# Patient Record
Sex: Male | Born: 1990 | Race: Black or African American | Hispanic: No | Marital: Single | Smoking: Never smoker
Health system: Southern US, Community
[De-identification: ages and names within clinical notes are randomized; demographics above are authoritative.]

## PROBLEM LIST (undated history)

## (undated) HISTORY — PX: SHOULDER SURGERY: SHX246

---
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
## 2010-02-25 ENCOUNTER — Emergency Department (HOSPITAL_COMMUNITY)
Admission: EM | Admit: 2010-02-25 | Discharge: 2010-02-25 | Payer: Self-pay | Source: Home / Self Care | Admitting: Emergency Medicine

## 2010-03-11 ENCOUNTER — Encounter (INDEPENDENT_AMBULATORY_CARE_PROVIDER_SITE_OTHER): Payer: Self-pay | Admitting: *Deleted

## 2010-10-06 ENCOUNTER — Ambulatory Visit
Admission: RE | Admit: 2010-10-06 | Discharge: 2010-10-06 | Payer: Self-pay | Source: Home / Self Care | Attending: Orthopedic Surgery | Admitting: Orthopedic Surgery

## 2010-10-06 ENCOUNTER — Encounter: Payer: Self-pay | Admitting: Orthopedic Surgery

## 2010-10-06 DIAGNOSIS — M24819 Other specific joint derangements of unspecified shoulder, not elsewhere classified: Secondary | ICD-10-CM | POA: Insufficient documentation

## 2010-11-01 NOTE — Letter (Signed)
Summary: Generic Letter for [facility]  [name]  [place], [place] [address]   Phone: [phone]  Fax: [phone]       03/11/2010  [facility] [address] [place], [place]  [address]   Dear [name], and To Whom This May Concern:  Antonio Mitchell is release from treatment/surgery for his left shoulder.   If further information is necessary, please contact this office.    Sincerely,   Terrance Mass, MD
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
## 2010-11-03 NOTE — Letter (Signed)
Summary: Notes faxed to [employer]  Notes faxed to [employer]   Imported By: Jacklynn Ganong 10/06/2010 15:18:41  _____________________________________________________________________  External Attachment:    Type:   Image     Comment:   External Document

## 2010-11-03 NOTE — Letter (Signed)
Summary: Generic Letter  [name]  [name]  [place], [place] [address]   Phone: [phone]  Fax: [phone]    10/06/2010   Ref:  Antonio Mitchell [address] [place], [place]  [address]  Dear [employer] recruiter:   [name] had successful repair of his left shoulder in 2008 and has been symptom free since then. I re-examined him today and his shoulder is stable. He is cleared to do any [employer] related activity.     Sincerely,   Fuller Canada MD

## 2011-02-14 NOTE — Op Note (Signed)
NAME:  Downen, Tavarus              ACCOUNT NO.:  [id number]   MEDICAL RECORD NO.:  [id number]          PATIENT TYPE:  INP   LOCATION:  A311                          FACILITY:  [facility]   PHYSICIAN:  Vickki Hearing, M.D.DATE OF BIRTH:  1991-05-17   DATE OF PROCEDURE:  06/14/2007  DATE OF DISCHARGE:                               OPERATIVE REPORT   HISTORY:  20 year old male had his third dislocation of his left  shoulder.  The initial dislocation was traumatic secondary to a football  injury. Dislocated a second time, had physical therapy, was noncompliant  with wearing a shoulder harness. Several months back, he was getting off  of a couch, did a push up, and the shoulder came out of place. After an  extended discussion with his mother, they opted for an open Bankart  procedure versus arthroscopic procedure in [facility]   PREOPERATIVE DIAGNOSIS:  Recurrent dislocation, left shoulder.   POSTOPERATIVE DIAGNOSIS:  Recurrent dislocation, left shoulder.   PROCEDURE:  Open left Bankart repair with capsular shift.   SURGEON:  Vickki Hearing, M.D.   ASSISTANT:  Laurena Bering   OPERATIVE FINDINGS:  There was a Bankart lesion from the 10 o'clock to 6  o'clock position of the left shoulder.  The capsular avulsion was noted  and there was redundant capsule.   ANESTHESIA:  General.   DESCRIPTION OF PROCEDURE:  The patient was properly identified in the  preop holding area, the left shoulder marked for surgery, countersigned  by the surgeon, history and physical updated, antibiotics given.  The  patient was taken to surgery for general anesthetic. After general  anesthetic, exam under anesthesia was done on the left shoulder.  Although there was no inferior subluxation, there was anterior  subluxation with abduction and external rotation.  This would best be  graded as a grade 2 instability with no clunk.  The shoulder was  dislocateble and spontaneously re-located without any  anterior pressure.   The time out procedure was completed after prep and drape.  The subcu  tissue was infiltrated with Marcaine and epinephrine solution.  A  straight incision was made in the axillary fold lateral to the coracoid,  the subcutaneous tissue divided, the cephalic vein found, preserved, and  retracted laterally.  The clavipectoral fascia was dissected until the  lateral portion of the strap muscle was found. This was retracted  medially.  The arm was placed in maximum external rotation.  The  subscapularis muscle and tendon were incised 1 cm from the tuberosity.  The inferior third was preserved. The vasculature was also preserved.  The subscapularis was retracted medially.  A dinner fork retractor was  placed in the scapular neck. The capsule was incised vertically and  entered 5 mm lateral to the glenoid rim.  The joint was inspected,  irrigated, and found to be clean.  A Bankart lesion was found medially.  Four suture anchors were placed at 6, 8, 10, and 11 o'clock. Inferior  capsular redundancy was removed by placing the inferior suture and  advancing the capsule superiorly and medially.  The capsule was further  advanced superiorly and medially with the sutures through the suture  anchors. This was done with the arm in 30 degrees external rotation.  Pants over vest repair was completed.  The sutures were then cut.  The  arm was checked and I was able to get 30 degrees external rotation at 0  degrees, adduction and in the neutral scapular plane.   We then repaired the subscap with 2-0 Ethibond suture.  We checked  external rotation with the arm and found the suture line to be stable.  We were able to get 30 degrees external rotation without any problem.  We irrigated the wound and closed with 2-0 Monocryl over a pain pump  catheter, placed staples in the skin, applied a sterile bandage and  placed the patient in a Cryo/cuff.  He was then extubated and taken to  the  recovery room in stable condition.   We will use  the standard Bankart reconstruction protocol limiting  external rotation to 15 degrees in the first two weeks, advance to 30  degrees at four weeks, and then as tolerated.  Passive range of motion 0  to 90 for the first two weeks, active assisted range of motion, as well,  then advance as tolerated. Elbow, wrist and hand motion can start  immediately.      Vickki Hearing, M.D.  Electronically Signed     SEH/MEDQ  D:  06/14/2007  T:  06/15/2007  Job:  [id number]
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
## 2011-07-14 LAB — HEMOGLOBIN AND HEMATOCRIT, BLOOD
HCT: 40
Hemoglobin: 13.5

## 2012-03-21 ENCOUNTER — Encounter (HOSPITAL_COMMUNITY): Payer: Self-pay

## 2012-03-21 ENCOUNTER — Emergency Department (HOSPITAL_COMMUNITY)
Admission: EM | Admit: 2012-03-21 | Discharge: 2012-03-21 | Disposition: A | Discharge status: Home / Self Care | Payer: Self-pay | Attending: Emergency Medicine | Admitting: Emergency Medicine

## 2012-03-21 ENCOUNTER — Emergency Department (HOSPITAL_COMMUNITY): Payer: Self-pay

## 2012-03-21 DIAGNOSIS — S93409A Sprain of unspecified ligament of unspecified ankle, initial encounter: Secondary | ICD-10-CM | POA: Insufficient documentation

## 2012-03-21 DIAGNOSIS — M25579 Pain in unspecified ankle and joints of unspecified foot: Secondary | ICD-10-CM | POA: Insufficient documentation

## 2012-03-21 DIAGNOSIS — X500XXA Overexertion from strenuous movement or load, initial encounter: Secondary | ICD-10-CM | POA: Insufficient documentation

## 2012-03-21 DIAGNOSIS — S93402A Sprain of unspecified ligament of left ankle, initial encounter: Secondary | ICD-10-CM

## 2012-03-21 DIAGNOSIS — Y9367 Activity, basketball: Secondary | ICD-10-CM | POA: Insufficient documentation

## 2012-03-21 NOTE — ED Provider Notes (Signed)
History   This chart was scribed for Antonio Gaskins, MD by Antonio Mitchell. The patient was seen in room APA11/APA11. Patient's care was started at 0842.    CSN: [id number]  Arrival date & time 03/21/12  4098   First MD Initiated Contact with Patient 03/21/12 [phone]      Chief Complaint  Patient presents with  . Ankle Pain     HPI Antonio Mitchell is a 21 y.o. male who presents to the Emergency Department complaining of moderate left ankle pain onset 2 weeks ago after sustaining an injury while playing basketball and persistent since. Patient notes pain is aggravated with movement but he is able to walk without significant difficulty. Patient also notes pain is aggravated with dorsiflexion of left ankle. Denies numbness, tingling, back pain, weakness. Reports he previously sustained fracture to bilateral ankles. Patient with no other significant medical history.   PMH - none  History reviewed. No pertinent past surgical history.  No family history on file.  History  Substance Use Topics  . Smoking status: Never Smoker   . Smokeless tobacco: Not on file  . Alcohol Use: No      Review of Systems  Musculoskeletal: Negative for joint swelling.  Neurological: Negative for weakness.     Allergies  Review of patient's allergies indicates no known allergies.  Home Medications  No current outpatient prescriptions on file.  BP 140/88  Pulse 66  Temp 98.1 F (36.7 C) (Oral)  Resp 20  Ht 6' (1.829 m)  Wt 221 lb 2 oz (100.302 kg)  BMI 29.99 kg/m2  SpO2 98%  Physical Exam CONSTITUTIONAL: Well developed/well nourished HEAD AND FACE: Normocephalic/atraumatic EYES: EOMI/PERRL ENMT: Mucous membranes moist NECK: supple no meningeal signs SPINE:entire spine nontender CV: S1/S2 noted, no murmurs/rubs/gallops noted LUNGS: Lungs are clear to auscultation bilaterally, no apparent distress NEURO: Pt is awake/alert, moves all extremitiesx4 EXTREMITIES: pulses normal, full ROM,  DP and PT pulses normal, lateral and medial malleolus of left ankle non-tender, no significant swelling or tenderness to left ankle, no proximal left fibular tenderness SKIN: warm, color normal PSYCH: no abnormalities of mood noted  ED Course  Procedures   DIAGNOSTIC STUDIES: Oxygen Saturation is 98% on room air, normal by my interpretation.    COORDINATION OF CARE: 8:54AM-Patient informed of current plan for treatment and evaluation and agrees with plan at this time. Will obtain x-ray at request of patient. Advised to limit activity that use Ibuprofen.      MDM  Nursing notes including past medical history and social history reviewed and considered in documentation xrays reviewed and considered       I personally performed the services described in this documentation, which was scribed in my presence. The recorded information has been reviewed and considered.      Antonio Gaskins, MD 03/21/12 [phone]

## 2012-03-21 NOTE — ED Notes (Signed)
Pt reports that he "tweeked" his left ankle 2-3 weeks ago

## 2012-03-21 NOTE — ED Notes (Signed)
Patient with no complaints at this time. Respirations even and unlabored. Skin warm/dry. Discharge instructions reviewed with patient at this time. Patient given opportunity to voice concerns/ask questions. Patient discharged at this time and left Emergency Department with steady gait.   

## 2012-03-21 NOTE — Discharge Instructions (Signed)
Ankle Sprain An ankle sprain is an injury to the strong, fibrous tissues (ligaments) that hold the bones of your ankle joint together.  CAUSES Ankle sprain usually is caused by a fall or by twisting your ankle. People who participate in sports are more prone to these types of injuries.  SYMPTOMS  Symptoms of ankle sprain include:  Pain in your ankle. The pain may be present at rest or only when you are trying to stand or walk.   Swelling.   Bruising. Bruising may develop immediately or within 1 to 2 days after your injury.   Difficulty standing or walking.  DIAGNOSIS  Your caregiver will ask you details about your injury and perform a physical exam of your ankle to determine if you have an ankle sprain. During the physical exam, your caregiver will press and squeeze specific areas of your foot and ankle. Your caregiver will try to move your ankle in certain ways. An X-ray exam may be done to be sure a bone was not broken or a ligament did not separate from one of the bones in your ankle (avulsion).  TREATMENT  Certain types of braces can help stabilize your ankle. Your caregiver can make a recommendation for this. Your caregiver may recommend the use of medication for pain. If your sprain is severe, your caregiver may refer you to a surgeon who helps to restore function to parts of your skeletal system (orthopedist) or a physical therapist. HOME CARE INSTRUCTIONS  Apply ice to your injury for 1 to 2 days or as directed by your caregiver. Applying ice helps to reduce inflammation and pain.  Put ice in a plastic bag.   Place a towel between your skin and the bag.   Leave the ice on for 15 to 20 minutes at a time, every 2 hours while you are awake.   Take over-the-counter or prescription medicines for pain, discomfort, or fever only as directed by your caregiver.   Keep your injured leg elevated, when possible, to lessen swelling.   If your caregiver recommends crutches, use them as  instructed. Gradually, put weight on the affected ankle. Continue to use crutches or a cane until you can walk without feeling pain in your ankle.   If you have a plaster splint, wear the splint as directed by your caregiver. Do not rest it on anything harder than a pillow the first 24 hours. Do not put weight on it. Do not get it wet. You may take it off to take a shower or bath.   You may have been given an elastic bandage to wear around your ankle to provide support. If the elastic bandage is too tight (you have numbness or tingling in your foot or your foot becomes cold and blue), adjust the bandage to make it comfortable.   If you have an air splint, you may blow more air into it or let air out to make it more comfortable. You may take your splint off at night and before taking a shower or bath.   Wiggle your toes in the splint several times per day if you are able.  SEEK MEDICAL CARE IF:   You have an increase in bruising, swelling, or pain.   Your toes feel cold.   Pain relief is not achieved with medication.  SEEK IMMEDIATE MEDICAL CARE IF: Your toes are numb or blue or you have severe pain. MAKE SURE YOU:   Understand these instructions.   Will watch your condition.     Will get help right away if you are not doing well or get worse.  Document Released: 09/18/2005 Document Revised: 09/07/2011 Document Reviewed: 04/22/2008 [employer] Patient Information 2012 [employer]. 

## 2012-07-05 ENCOUNTER — Emergency Department (HOSPITAL_COMMUNITY)
Admission: EM | Admit: 2012-07-05 | Discharge: 2012-07-05 | Disposition: A | Discharge status: Home / Self Care | Payer: No Typology Code available for payment source | Attending: Emergency Medicine | Admitting: Emergency Medicine

## 2012-07-05 ENCOUNTER — Encounter (HOSPITAL_COMMUNITY): Payer: Self-pay | Admitting: Neurology

## 2012-07-05 DIAGNOSIS — Z043 Encounter for examination and observation following other accident: Secondary | ICD-10-CM | POA: Insufficient documentation

## 2012-07-05 MED ORDER — IBUPROFEN 600 MG PO TABS: 600.0000 mg | ORAL_TABLET | Freq: Three times a day (TID) | ORAL | Status: AC
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
## 2012-07-22 ENCOUNTER — Encounter (HOSPITAL_COMMUNITY): Payer: Self-pay | Admitting: Emergency Medicine

## 2012-07-22 ENCOUNTER — Emergency Department (HOSPITAL_COMMUNITY)
Admission: EM | Admit: 2012-07-22 | Discharge: 2012-07-22 | Disposition: A | Discharge status: Home / Self Care | Payer: No Typology Code available for payment source | Attending: Emergency Medicine | Admitting: Emergency Medicine

## 2012-07-22 DIAGNOSIS — X500XXA Overexertion from strenuous movement or load, initial encounter: Secondary | ICD-10-CM | POA: Insufficient documentation

## 2012-07-22 DIAGNOSIS — Y9229 Other specified public building as the place of occurrence of the external cause: Secondary | ICD-10-CM | POA: Insufficient documentation

## 2012-07-22 DIAGNOSIS — S335XXA Sprain of ligaments of lumbar spine, initial encounter: Secondary | ICD-10-CM | POA: Insufficient documentation

## 2012-07-22 DIAGNOSIS — Y9389 Activity, other specified: Secondary | ICD-10-CM | POA: Insufficient documentation

## 2012-07-22 DIAGNOSIS — S39012A Strain of muscle, fascia and tendon of lower back, initial encounter: Secondary | ICD-10-CM

## 2012-07-22 MED ORDER — BACLOFEN 10 MG PO TABS: 10.0000 mg | ORAL_TABLET | Freq: Three times a day (TID) | ORAL | Status: AC

## 2012-07-22 MED ORDER — MELOXICAM 7.5 MG PO TABS: ORAL_TABLET | ORAL | Status: AC
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
## 2012-08-10 NOTE — ED Provider Notes (Signed)
Medical screening examination/treatment/procedure(s) were performed by non-physician practitioner and as supervising physician I was immediately available for consultation/collaboration. Devoria Albe, MD, Armando Gang   Ward Givens, MD 08/10/12 [phone]

## 2014-05-12 ENCOUNTER — Encounter (HOSPITAL_BASED_OUTPATIENT_CLINIC_OR_DEPARTMENT_OTHER): Payer: Self-pay | Admitting: Emergency Medicine

## 2014-05-12 DIAGNOSIS — Z791 Long term (current) use of non-steroidal anti-inflammatories (NSAID): Secondary | ICD-10-CM | POA: Diagnosis not present

## 2014-05-12 DIAGNOSIS — Y990 Civilian activity done for income or pay: Secondary | ICD-10-CM | POA: Insufficient documentation

## 2014-05-12 DIAGNOSIS — W010XXA Fall on same level from slipping, tripping and stumbling without subsequent striking against object, initial encounter: Secondary | ICD-10-CM | POA: Insufficient documentation

## 2014-05-12 DIAGNOSIS — IMO0002 Reserved for concepts with insufficient information to code with codable children: Secondary | ICD-10-CM | POA: Diagnosis not present

## 2014-05-12 DIAGNOSIS — Y9389 Activity, other specified: Secondary | ICD-10-CM | POA: Insufficient documentation

## 2014-05-12 DIAGNOSIS — Y9289 Other specified places as the place of occurrence of the external cause: Secondary | ICD-10-CM | POA: Insufficient documentation

## 2014-05-12 NOTE — ED Notes (Signed)
Pt states he was standing on a palette at work that fell in-pt slipped/did not fall to ground-pain to lower back-WC injury

## 2014-05-13 ENCOUNTER — Encounter (HOSPITAL_BASED_OUTPATIENT_CLINIC_OR_DEPARTMENT_OTHER): Payer: Self-pay | Admitting: Emergency Medicine

## 2014-05-13 ENCOUNTER — Emergency Department (HOSPITAL_BASED_OUTPATIENT_CLINIC_OR_DEPARTMENT_OTHER): Payer: Worker's Compensation

## 2014-05-13 ENCOUNTER — Emergency Department (HOSPITAL_BASED_OUTPATIENT_CLINIC_OR_DEPARTMENT_OTHER)
Admission: EM | Admit: 2014-05-13 | Discharge: 2014-05-13 | Disposition: A | Discharge status: Home / Self Care | Payer: Worker's Compensation | Attending: Emergency Medicine | Admitting: Emergency Medicine

## 2014-05-13 DIAGNOSIS — M62830 Muscle spasm of back: Secondary | ICD-10-CM

## 2014-05-13 MED ORDER — METHOCARBAMOL 500 MG PO TABS
ORAL_TABLET | ORAL | Status: AC
  Administered 2014-05-13: 500 mg via ORAL
  Filled 2014-05-13: qty 1

## 2014-05-13 MED ORDER — IBUPROFEN 400 MG PO TABS
ORAL_TABLET | ORAL | Status: AC
  Administered 2014-05-13: 01:00:00 via ORAL
  Filled 2014-05-13: qty 1

## 2014-05-13 NOTE — ED Notes (Signed)
Pt returned from xray

## 2014-05-13 NOTE — ED Provider Notes (Signed)
CSN: [id number]     Arrival date & time 05/12/14  2239 History   First MD Initiated Contact with Patient 05/13/14 [phone]     Chief Complaint  Patient presents with  . Fall     (Consider location/radiation/quality/duration/timing/severity/associated sxs/prior Treatment) Patient is a 23 y.o. male presenting with fall. The history is provided by the patient.  Fall This is a new problem. The current episode started 1 to 2 hours ago. The problem occurs constantly. The problem has not changed since onset.Pertinent negatives include no chest pain, no abdominal pain, no headaches and no shortness of breath. Nothing aggravates the symptoms. Nothing relieves the symptoms. He has tried nothing for the symptoms. The treatment provided no relief.  palette he was standing on dropped 18 inches and jarred lower back.  No weakness or nubness gait in tact  History reviewed. No pertinent past medical history. Past Surgical History  Procedure Laterality Date  . Shoulder surgery     History reviewed. No pertinent family history. History  Substance Use Topics  . Smoking status: Never Smoker   . Smokeless tobacco: Not on file  . Alcohol Use: No    Review of Systems  Constitutional: Negative for fever.  Respiratory: Negative for shortness of breath.   Cardiovascular: Negative for chest pain.  Gastrointestinal: Negative for abdominal pain.  Genitourinary: Negative for difficulty urinating.  Musculoskeletal: Positive for back pain.  Neurological: Negative for weakness, numbness and headaches.  All other systems reviewed and are negative.     Allergies  Review of patient's allergies indicates no known allergies.  Home Medications   Prior to Admission medications   Medication Sig Start Date End Date Taking? Authorizing Provider  meloxicam (MOBIC) 7.5 MG tablet 1 po bid with food 07/22/12   Kathie DikeHobson M Bryant, PA-C   BP 122/68  Pulse 63  Temp(Src) 98 F (36.7 C) (Oral)  Resp 18  Ht 6' (1.829 m)   Wt 175 lb (79.379 kg)  BMI 23.73 kg/m2  SpO2 100% Physical Exam  Constitutional: He is oriented to person, place, and time. He appears well-developed and well-nourished. No distress.  HENT:  Head: Normocephalic and atraumatic.  Mouth/Throat: Oropharynx is clear and moist.  Eyes: Conjunctivae are normal. Pupils are equal, round, and reactive to light.  Neck: Normal range of motion. Neck supple.  Cardiovascular: Normal rate, regular rhythm and intact distal pulses.   Pulmonary/Chest: Effort normal and breath sounds normal. He has no wheezes. He has no rales.  Abdominal: Soft. Bowel sounds are normal. There is no tenderness. There is no rebound and no guarding.  Musculoskeletal: Normal range of motion. He exhibits no edema and no tenderness.  Neurological: He is alert and oriented to person, place, and time. He has normal reflexes.  L5/S1 intact intact perineal sensation intact gait.  No step offs nor crepitance nor point tenderness of the c t or l spine  Skin: Skin is warm and dry.  Psychiatric: He has a normal mood and affect.    ED Course  Procedures (including critical care time) Labs Review Labs Reviewed - No data to display  Imaging Review Dg Lumbar Spine Complete  05/13/2014   CLINICAL DATA:  Status post fall; lower back pain.  EXAM: LUMBAR SPINE - COMPLETE 4+ VIEW  COMPARISON:  None.  FINDINGS: There is no evidence of fracture or subluxation. Vertebral bodies demonstrate normal height and alignment. Intervertebral disc spaces are preserved. The visualized neural foramina are grossly unremarkable in appearance.  The visualized bowel gas  pattern is unremarkable in appearance; air and stool are noted within the colon. The sacroiliac joints are within normal limits.  IMPRESSION: No evidence of fracture or subluxation along the lumbar spine.   Electronically Signed   By: Roanna Raider M.D.   On: 05/13/2014 01:04     EKG Interpretation None      MDM   Final diagnoses:  None     Spasm NSAIDs and muscle relaxants follow up with [facility] panel as directed    Jazaria Jarecki Smitty Cords, MD 05/13/14 1610

## 2014-08-01 ENCOUNTER — Emergency Department (INDEPENDENT_AMBULATORY_CARE_PROVIDER_SITE_OTHER)
Admission: EM | Admit: 2014-08-01 | Discharge: 2014-08-01 | Disposition: A | Discharge status: Home / Self Care | Payer: Self-pay | Source: Home / Self Care | Attending: Emergency Medicine | Admitting: Emergency Medicine

## 2014-08-01 ENCOUNTER — Encounter (HOSPITAL_COMMUNITY): Payer: Self-pay | Admitting: Emergency Medicine

## 2014-08-01 DIAGNOSIS — K2970 Gastritis, unspecified, without bleeding: Secondary | ICD-10-CM

## 2014-08-01 MED ORDER — ONDANSETRON HCL 4 MG PO TABS: 4.0000 mg | ORAL_TABLET | Freq: Three times a day (TID) | ORAL | Status: DC | PRN

## 2014-08-01 NOTE — ED Notes (Addendum)
Had onset 3 AM today of vomiting. C/o unable to keep anything down since then Denies diarrhea. No one else in home having symptoms

## 2014-08-01 NOTE — ED Provider Notes (Signed)
CSN: [id number]     Arrival date & time 08/01/14  1706 History   First MD Initiated Contact with Patient 08/01/14 1713     Chief Complaint  Patient presents with  . Emesis   (Consider location/radiation/quality/duration/timing/severity/associated sxs/prior Treatment) HPI He is a 23 year old man here for evaluation of vomiting. He states that starting at 3 AM this morning he has been unable to keep down solid food. He does okay with liquids. Emesis is nonbloody and nonbilious. He reports some intermittent abdominal cramping. He denies any diarrhea. He had [employer] before this started, and states that fries maybe tasted a little funny. He denies any fevers or chills. No rhinorrhea or cough.  History reviewed. No pertinent past medical history. Past Surgical History  Procedure Laterality Date  . Shoulder surgery     History reviewed. No pertinent family history. History  Substance Use Topics  . Smoking status: Never Smoker   . Smokeless tobacco: Not on file  . Alcohol Use: No    Review of Systems  Constitutional: Negative for fever and chills.  HENT: Negative.   Respiratory: Negative.   Cardiovascular: Negative.   Gastrointestinal: Positive for nausea, vomiting and abdominal pain. Negative for diarrhea.  Musculoskeletal: Negative.   Neurological: Negative.     Allergies  Review of patient's allergies indicates no known allergies.  Home Medications   Prior to Admission medications   Medication Sig Start Date End Date Taking? Authorizing Provider  meloxicam (MOBIC) 7.5 MG tablet 1 po bid with food 07/22/12   Kathie DikeHobson M Bryant, PA-C  ondansetron (ZOFRAN) 4 MG tablet Take 1 tablet (4 mg total) by mouth every 8 (eight) hours as needed for nausea or vomiting. 08/01/14   Charm RingsErin J Randel Hargens, MD   BP 139/64  Pulse 64  Temp(Src) 98.7 F (37.1 C) (Oral)  Resp 18  SpO2 100% Physical Exam  Constitutional: He is oriented to person, place, and time. He appears well-developed and  well-nourished. No distress.  HENT:  Head: Normocephalic and atraumatic.  Mouth/Throat: Mucous membranes are not dry.  Neck: Neck supple.  Cardiovascular: Normal rate, regular rhythm and normal heart sounds.   No murmur heard. Pulmonary/Chest: Effort normal and breath sounds normal. No respiratory distress. He has no wheezes. He has no rales.  Abdominal: Soft. Bowel sounds are normal. He exhibits no distension. There is tenderness (mild diffuse). There is no rebound and no guarding.  Lymphadenopathy:    He has no cervical adenopathy.  Neurological: He is alert and oriented to person, place, and time.    ED Course  Procedures (including critical care time) Labs Review Labs Reviewed - No data to display  Imaging Review No results found.   MDM   1. Gastritis    Suspect viral gastritis versus food poisoning. Symptomatic treatment with Zofran. Discussed importance of fluid intake. Reviewed warning signs as in after visit summary. Follow-up as needed.    Charm RingsErin J Rolando Whitby, MD 08/01/14 [phone]

## 2014-08-01 NOTE — Discharge Instructions (Signed)
You either have a stomach virus or [facility]. Make sure you are drinking plenty of clear liquids - water, clear sodas, chicken broth. Take zofran every 8 hours as needed for nausea. You may develop some diarrhea in the next day.  If you develop fevers, are unable to drink liquids, or develop severe abdominal pain, please go to the emergency room.

## 2014-09-10 ENCOUNTER — Emergency Department (HOSPITAL_COMMUNITY)
Admission: EM | Admit: 2014-09-10 | Discharge: 2014-09-10 | Disposition: A | Discharge status: Home / Self Care | Payer: Self-pay | Attending: Emergency Medicine | Admitting: Emergency Medicine

## 2014-09-10 ENCOUNTER — Encounter (HOSPITAL_COMMUNITY): Payer: Self-pay | Admitting: *Deleted

## 2014-09-10 ENCOUNTER — Emergency Department (HOSPITAL_COMMUNITY): Payer: Self-pay

## 2014-09-10 DIAGNOSIS — R1032 Left lower quadrant pain: Secondary | ICD-10-CM

## 2014-09-10 LAB — URINALYSIS, ROUTINE W REFLEX MICROSCOPIC
Bilirubin Urine: NEGATIVE
Glucose, UA: NEGATIVE mg/dL
Hgb urine dipstick: NEGATIVE
Ketones, ur: NEGATIVE mg/dL
Leukocytes, UA: NEGATIVE
Nitrite: NEGATIVE
Protein, ur: NEGATIVE mg/dL
SPECIFIC GRAVITY, URINE: 1.02 (ref 1.005–1.030)
Urobilinogen, UA: 1 mg/dL (ref 0.0–1.0)
pH: 7 (ref 5.0–8.0)

## 2014-09-10 NOTE — Care Management Note (Signed)
ED/CM noted patient did not have health insurance and/or PCP listed in the computer.  Patient was given the [place] resource handout with information on the clinics, food pantries, and the handout for new health insurance sign-up. Pt was also given a Rx discount card. Patient expressed appreciation for information received. 

## 2014-09-10 NOTE — Discharge Instructions (Signed)
Urine sample and x-ray were normal. Tylenol or ibuprofen for pain. Return if worse per

## 2014-09-10 NOTE — ED Notes (Signed)
Intermittent, sharp pain to LLQ began sometime during the night. Denies N/V/D or any other symptoms at this time.

## 2014-09-10 NOTE — ED Provider Notes (Signed)
CSN: [id number]     Arrival date & time 09/10/14  0813 History  This chart was scribed for Antonio HutchingBrian Kizer Nobbe, MD by Ronney LionSuzanne Le, ED Scribe. This patient was seen in room APA12/APA12 and the patient's care was started at 8:51 AM.    Chief Complaint  Patient presents with  . Abdominal Pain   The history is provided by the patient. No language interpreter was used.    HPI Comments: Antonio Mitchell is a 23 y.o. male who presents to the Emergency Department complaining of intermittent, sharp, non-radiating LLQ pain that lasts a few minutes at a time onset 3 hours ago. Patient denies any bladder or bowel changes, medical problems, or history of abdominal surgery. 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  History reviewed. No pertinent past medical history. Past Surgical History  Procedure Laterality Date  . Shoulder surgery    . Shoulder surgery     No family history on file. History  Substance Use Topics  . Smoking status: Never Smoker   . Smokeless tobacco: Not on file  . Alcohol Use: No    Review of Systems  Gastrointestinal: Negative for blood in stool.  Genitourinary: Negative for urgency, frequency, decreased urine volume and difficulty urinating.      Allergies  Review of patient's allergies indicates no known allergies.  Home Medications   Prior to Admission medications   Medication Sig Start Date End Date Taking? Authorizing Provider  meloxicam (MOBIC) 7.5 MG tablet 1 po bid with food Patient not taking: Reported on 09/10/2014 07/22/12   Kathie DikeHobson M Bryant, PA-C  ondansetron (ZOFRAN) 4 MG tablet Take 1 tablet (4 mg total) by mouth every 8 (eight) hours as needed for nausea or vomiting. Patient not taking: Reported on 09/10/2014 08/01/14   Charm RingsErin J Honig, MD   BP 120/68 mmHg  Pulse 52  Temp(Src) 98.2 F (36.8 C) (Oral)  Resp 18  Ht 5\' 11"  (1.803 m)  Wt 175 lb (79.379 kg)  BMI 24.42 kg/m2  SpO2 100% Physical  Exam  Constitutional: He is oriented to person, place, and time. He appears well-developed and well-nourished.  HENT:  Head: Normocephalic and atraumatic.  Eyes: Conjunctivae and EOM are normal. Pupils are equal, round, and reactive to light.  Neck: Normal range of motion. Neck supple.  Cardiovascular: Normal rate, regular rhythm and normal heart sounds.   Pulmonary/Chest: Effort normal and breath sounds normal.  Abdominal: Soft. Bowel sounds are normal.  Minimal tenderness to LLQ.  Musculoskeletal: Normal range of motion.  Neurological: He is alert and oriented to person, place, and time.  Skin: Skin is warm and dry.  Psychiatric: He has a normal mood and affect. His behavior is normal.  Nursing note and vitals reviewed.   ED Course  Procedures (including critical care time)  DIAGNOSTIC STUDIES: Oxygen Saturation is 100% on room air, normal by my interpretation.    COORDINATION OF CARE: 8:53 AM - Discussed treatment plan with pt at bedside which includes UA and abdominal XR and pt agreed to plan.   Labs Review Labs Reviewed  URINALYSIS, ROUTINE W REFLEX MICROSCOPIC    Imaging Review Dg Abd 1 View  09/10/2014   CLINICAL DATA:  Left lower quadrant pain  EXAM: ABDOMEN - 1 VIEW  COMPARISON:  05/13/2014  FINDINGS: The bowel gas pattern is normal. No radio-opaque calculi or other significant radiographic abnormality are seen.  IMPRESSION: Negative.   Electronically Signed  By: Marlan Palauharles  Clark M.D.   On: 09/10/2014 09:23     EKG Interpretation None      MDM   Final diagnoses:  LLQ pain   No acute abdomen. Urinalysis normal. KUB normal. Will treat with Tylenol and ibuprofen.  I personally performed the services described in this documentation, which was scribed in my presence. The recorded information has been reviewed and is accurate.         Antonio HutchingBrian Celica Kotowski, MD 09/10/14 [phone]

## 2014-12-08 ENCOUNTER — Encounter: Payer: Self-pay | Admitting: Orthopedic Surgery

## 2014-12-08 ENCOUNTER — Telehealth: Payer: Self-pay | Admitting: Orthopedic Surgery

## 2014-12-08 NOTE — Telephone Encounter (Signed)
Okay to update  letter no changes

## 2014-12-08 NOTE — Telephone Encounter (Signed)
Done. Patient aware to pick up °

## 2014-12-08 NOTE — Telephone Encounter (Signed)
Patient requests updated note for military, last note (issued 10/06/10) has been updated and entered into chart - please review it and advise, as patient would like to come and pick up today.

## 2015-07-03 ENCOUNTER — Encounter (HOSPITAL_COMMUNITY): Payer: Self-pay | Admitting: *Deleted

## 2015-07-03 ENCOUNTER — Emergency Department (INDEPENDENT_AMBULATORY_CARE_PROVIDER_SITE_OTHER)
Admission: EM | Admit: 2015-07-03 | Discharge: 2015-07-03 | Disposition: A | Discharge status: Home / Self Care | Payer: Self-pay | Source: Home / Self Care | Attending: Family Medicine | Admitting: Family Medicine

## 2015-07-03 DIAGNOSIS — K529 Noninfective gastroenteritis and colitis, unspecified: Secondary | ICD-10-CM

## 2015-07-03 MED ORDER — ONDANSETRON 4 MG PO TBDP
4.0000 mg | ORAL_TABLET | Freq: Once | ORAL | Status: AC
  Administered 2015-07-03: 4 mg via ORAL

## 2015-07-03 MED ORDER — ONDANSETRON HCL 4 MG PO TABS: 4.0000 mg | ORAL_TABLET | Freq: Four times a day (QID) | ORAL | Status: AC

## 2015-07-03 MED ORDER — ONDANSETRON 4 MG PO TBDP
ORAL_TABLET | ORAL | Status: AC
  Filled 2015-07-03: qty 1

## 2015-07-03 NOTE — Discharge Instructions (Signed)
Clear liquid , bland diet tonight as tolerated, advance on sunday as improved, use medicine as needed, return or see your doctor if any problems.

## 2015-07-03 NOTE — ED Notes (Signed)
Pt  Reports  Symptoms  Of  Abdominal  Pain   With  Some   Vomiting  As   Well    With  Symptoms  Of  Onset   X     2  Days           Vomited  X  1  Today     -  Pt  Appears  In no  Severe  distress

## 2015-07-03 NOTE — ED Provider Notes (Signed)
CSN: [id number]     Arrival date & time 07/03/15  1328 History   First MD Initiated Contact with Patient 07/03/15 1421     Chief Complaint  Patient presents with  . Abdominal Pain   (Consider location/radiation/quality/duration/timing/severity/associated sxs/prior Treatment) Patient is a 24 y.o. male presenting with abdominal pain. The history is provided by the patient.  Abdominal Pain Pain location:  Periumbilical Pain quality: cramping   Pain severity:  Mild Onset quality:  Sudden Duration:  3 days Progression:  Unchanged Chronicity:  New Context: diet changes   Context comment:  Eating [employer]. Worsened by:  Eating Associated symptoms: diarrhea, nausea and vomiting   Associated symptoms: no fever     History reviewed. No pertinent past medical history. Past Surgical History  Procedure Laterality Date  . Shoulder surgery    . Shoulder surgery     History reviewed. No pertinent family history. Social History  Substance Use Topics  . Smoking status: Never Smoker   . Smokeless tobacco: None  . Alcohol Use: No    Review of Systems  Constitutional: Negative.  Negative for fever.  HENT: Negative.   Gastrointestinal: Positive for nausea, vomiting, abdominal pain and diarrhea. Negative for blood in stool and anal bleeding.  Genitourinary: Negative.   All other systems reviewed and are negative.   Allergies  Review of patient's allergies indicates no known allergies.  Home Medications   Prior to Admission medications   Medication Sig Start Date End Date Taking? Authorizing Provider  meloxicam (MOBIC) 7.5 MG tablet 1 po bid with food Patient not taking: Reported on 09/10/2014 07/22/12   Ivery Quale, PA-C  ondansetron (ZOFRAN) 4 MG tablet Take 1 tablet (4 mg total) by mouth every 6 (six) hours. Prn n/v. 07/03/15   Linna Hoff, MD   Meds Ordered and Administered this Visit   Medications  ondansetron (ZOFRAN-ODT) disintegrating tablet 4 mg (not  administered)    BP 137/77 mmHg  Pulse 56  Temp(Src) 98.2 F (36.8 C) (Oral)  Resp 16  SpO2 100% No data found.   Physical Exam  Constitutional: He is oriented to person, place, and time. He appears well-developed and well-nourished. No distress.  Neck: Normal range of motion. Neck supple.  Pulmonary/Chest: Effort normal and breath sounds normal.  Abdominal: Soft. Normal appearance and bowel sounds are normal. He exhibits no distension and no mass. There is tenderness in the epigastric area and periumbilical area. There is no rigidity, no rebound, no guarding, no CVA tenderness, no tenderness at McBurney's point and negative Murphy's sign.  Lymphadenopathy:    He has no cervical adenopathy.  Neurological: He is alert and oriented to person, place, and time.  Skin: Skin is warm and dry.  Nursing note and vitals reviewed.   ED Course  Procedures (including critical care time)  Labs Review Labs Reviewed - No data to display  Imaging Review No results found.   Visual Acuity Review  Right Eye Distance:   Left Eye Distance:   Bilateral Distance:    Right Eye Near:   Left Eye Near:    Bilateral Near:         MDM   1. Gastroenteritis, acute    rx for zofran for sx.    Linna Hoff, MD 07/03/15 [phone]

## 2015-11-13 IMAGING — CR DG ABDOMEN 1V
1 series · 1 of 1 positions shown · non-contrast
Comparison: 05/13/2014

CLINICAL DATA: Left lower quadrant pain

EXAM:
ABDOMEN - 1 VIEW

[view not recorded]
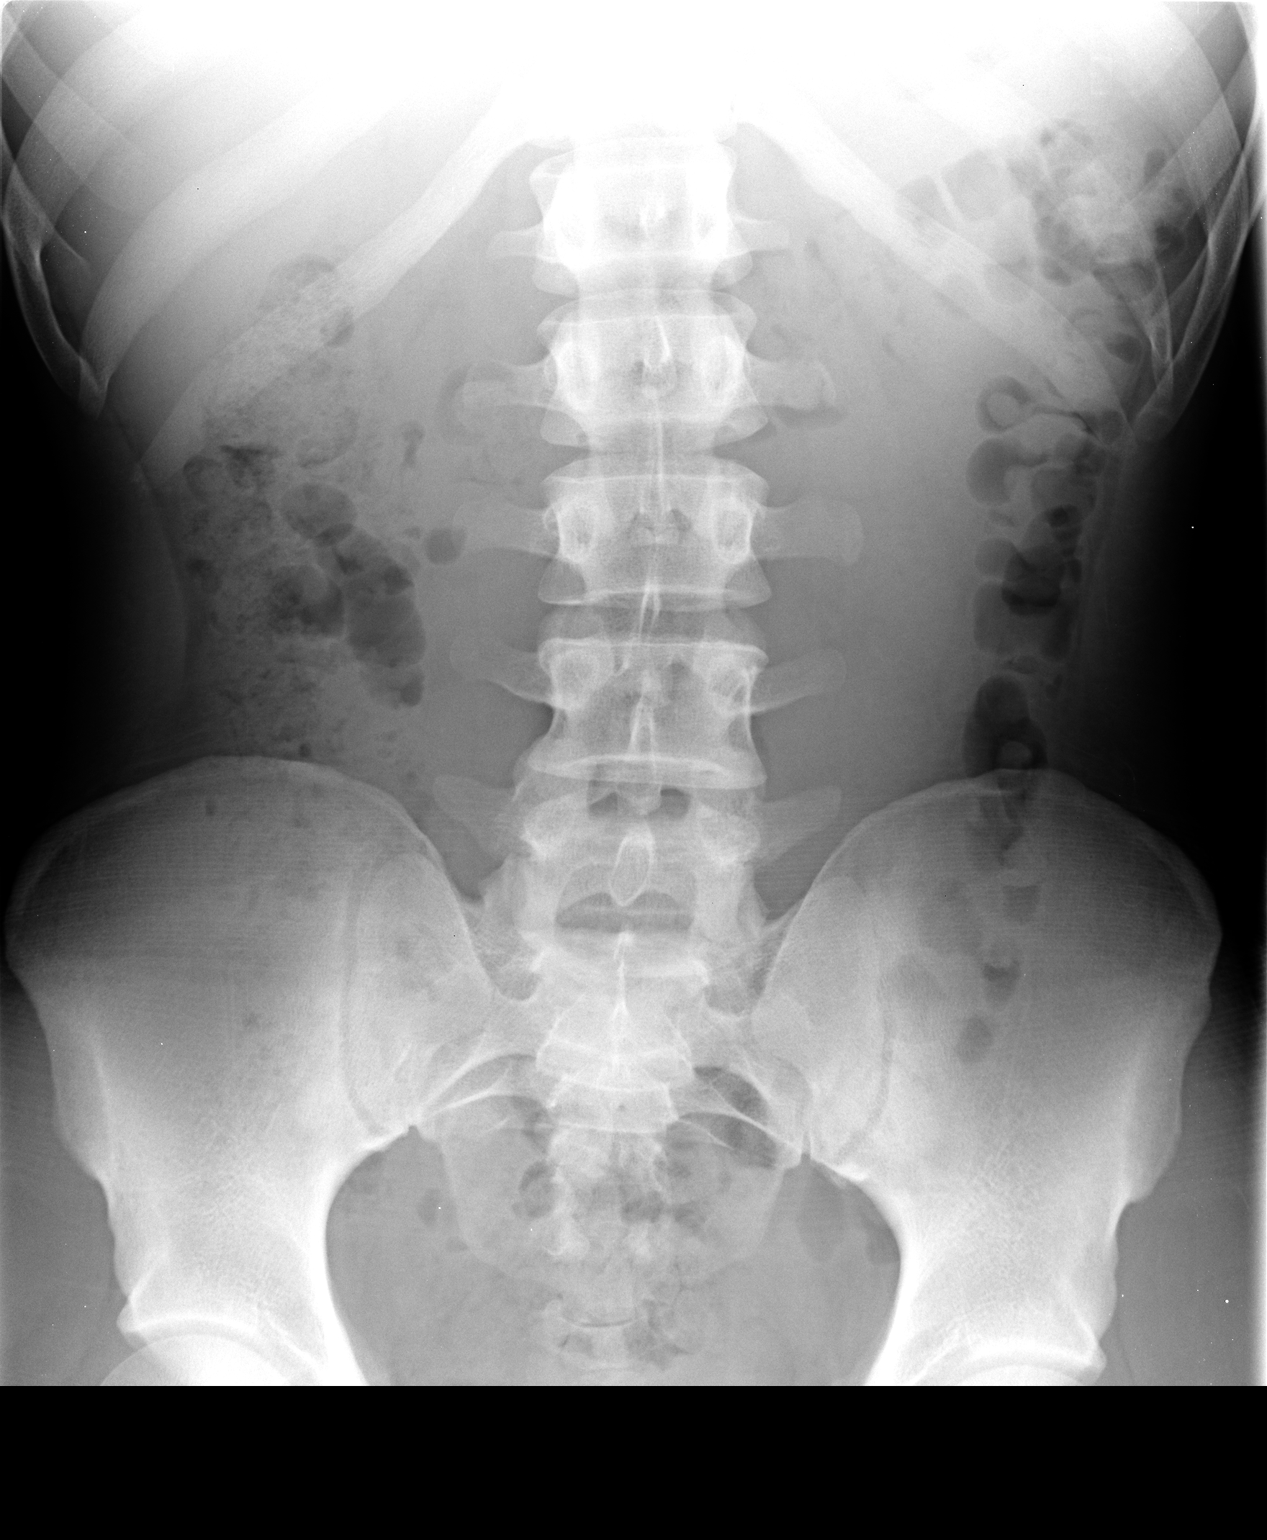

[1 of 1 positions shown; findings below may reference images not displayed]

FINDINGS: The bowel gas pattern is normal. No radio-opaque calculi or other
significant radiographic abnormality are seen.
IMPRESSION: Negative.

## 2016-03-04 ENCOUNTER — Ambulatory Visit (HOSPITAL_COMMUNITY)
Admission: EM | Admit: 2016-03-04 | Discharge: 2016-03-04 | Disposition: A | Discharge status: Home / Self Care | Payer: Self-pay | Attending: Family Medicine | Admitting: Family Medicine

## 2016-03-04 ENCOUNTER — Encounter (HOSPITAL_COMMUNITY): Payer: Self-pay | Admitting: Emergency Medicine

## 2016-03-04 DIAGNOSIS — H6691 Otitis media, unspecified, right ear: Secondary | ICD-10-CM

## 2016-03-04 DIAGNOSIS — J40 Bronchitis, not specified as acute or chronic: Secondary | ICD-10-CM

## 2016-03-04 DIAGNOSIS — H11442 Conjunctival cysts, left eye: Secondary | ICD-10-CM

## 2016-03-04 MED ORDER — ERYTHROMYCIN 5 MG/GM OP OINT: 1.0000 "application " | TOPICAL_OINTMENT | Freq: Three times a day (TID) | OPHTHALMIC | Status: AC

## 2016-03-04 MED ORDER — AMOXICILLIN-POT CLAVULANATE 875-125 MG PO TABS: 1.0000 | ORAL_TABLET | Freq: Two times a day (BID) | ORAL | Status: AC

## 2016-03-04 MED ORDER — HYDROCOD POLST-CPM POLST ER 10-8 MG/5ML PO SUER: 5.0000 mL | Freq: Every evening | ORAL | Status: AC | PRN

## 2016-03-04 MED ORDER — ALBUTEROL SULFATE HFA 108 (90 BASE) MCG/ACT IN AERS: 1.0000 | INHALATION_SPRAY | Freq: Four times a day (QID) | RESPIRATORY_TRACT | Status: AC | PRN

## 2016-03-04 NOTE — ED Provider Notes (Signed)
CSN: [id number]     Arrival date & time 03/04/16  1259 History   First MD Initiated Contact with Patient 03/04/16 1336     Chief Complaint  Patient presents with  . Cough  . Otalgia   (Consider location/radiation/quality/duration/timing/severity/associated sxs/prior Treatment) Patient is a 25 y.o. male presenting with cough and ear pain.  Cough Cough characteristics:  Harsh, dry and hacking Severity:  Moderate Onset quality:  Gradual Duration:  2 weeks Timing:  Intermittent Progression:  Unchanged Chronicity:  New Smoker: no   Context: sick contacts   Context: not animal exposure, not exposure to allergens, not fumes, not occupational exposure, not smoke exposure, not upper respiratory infection, not weather changes and not with activity   Relieved by:  None tried Worsened by:  Nothing tried Ineffective treatments:  None tried Associated symptoms: chills, ear pain, eye discharge and fever   Associated symptoms: no shortness of breath and no wheezing   Risk factors: no recent infection   Otalgia Associated symptoms: congestion, cough and fever   Otalgia  (R) ear pain x 2 days. No drainage Eye drainage States nioted redness and irritation to (L) eye last night and noted yellow drainage in the eye this am when he awoke.   History reviewed. No pertinent past medical history. Past Surgical History  Procedure Laterality Date  . Shoulder surgery    . Shoulder surgery     History reviewed. No pertinent family history. Social History  Substance Use Topics  . Smoking status: Never Smoker   . Smokeless tobacco: None  . Alcohol Use: No    Review of Systems  Constitutional: Positive for fever and chills. Negative for activity change and appetite change.  HENT: Positive for congestion and ear pain.   Eyes: Positive for discharge and redness. Negative for visual disturbance.  Respiratory: Positive for cough. Negative for shortness of breath and wheezing.   Endocrine: Negative.    Genitourinary: Negative.   Allergic/Immunologic: Negative.   Neurological: Negative.   Hematological: Negative.   Psychiatric/Behavioral: Negative.     Allergies  Review of patient's allergies indicates no known allergies.  Home Medications   Prior to Admission medications   Medication Sig Start Date End Date Taking? Authorizing Provider  albuterol (PROVENTIL HFA;VENTOLIN HFA) 108 (90 Base) MCG/ACT inhaler Inhale 1-2 puffs into the lungs every 6 (six) hours as needed for wheezing or shortness of breath (or cough). 03/04/16   Roma KayserKatherine P Noha Karasik, NP  amoxicillin-clavulanate (AUGMENTIN) 875-125 MG tablet Take 1 tablet by mouth every 12 (twelve) hours. 03/04/16   Roma KayserKatherine P Coralie Stanke, NP  chlorpheniramine-HYDROcodone (TUSSIONEX PENNKINETIC ER) 10-8 MG/5ML SUER Take 5 mLs by mouth at bedtime as needed for cough. 03/04/16   Roma KayserKatherine P Kaysie Michelini, NP  erythromycin [facility](ROMYCIN) ophthalmic ointment Place 1 application into the left eye 3 (three) times daily. For 7 days 03/04/16   Leanne ChangKatherine P Bernisha Verma, NP  meloxicam (MOBIC) 7.5 MG tablet 1 po bid with food Patient not taking: Reported on 09/10/2014 07/22/12   Ivery QualeHobson Bryant, PA-C  ondansetron (ZOFRAN) 4 MG tablet Take 1 tablet (4 mg total) by mouth every 6 (six) hours. Prn n/v. 07/03/15   Linna HoffJames D Kindl, MD   Meds Ordered and Administered this Visit  Medications - No data to display  BP 130/70 mmHg  Pulse 67  Temp(Src) 98.5 F (36.9 C) (Oral)  SpO2 99% No data found.   Physical Exam  Constitutional: He is oriented to person, place, and time. He appears well-developed and well-nourished.  HENT:  Head: Normocephalic and atraumatic.  Right Ear: Tympanic membrane is erythematous.  Left Ear: Tympanic membrane normal.  Eyes: Conjunctivae are normal.  Mild erythema to (L) conjunctiva  Cardiovascular: Normal rate.   Pulmonary/Chest: Breath sounds normal. No respiratory distress.  Neurological: He is alert and oriented to person, place, and time.  Skin: Skin  is warm and dry.  Psychiatric: He has a normal mood and affect.    ED Course  Procedures (including critical care time)  Labs Review Labs Reviewed - No data to display  Imaging Review No results found.   Visual Acuity Review  Right Eye Distance:   Left Eye Distance:   Bilateral Distance:    Right Eye Near:   Left Eye Near:    Bilateral Near:         MDM   1. Acute right otitis media, recurrence not specified, unspecified otitis media type   2. Bronchitis   3. Conjunctival cyst of left eye    Persistent cough x 2 weeks w/ onset of ear pain 2 days ago and (L) eye irritation yesterday. Exposed to friend w/ similar symptoms. Will treat with Augmentin, E-Mycin Opth ointment, albuterol HFA and a short course of Tussionex. Home instruction in print provided.    Leanne Chang, NP 03/04/16 1402

## 2016-03-04 NOTE — Discharge Instructions (Signed)
How to Use an Inhaler Using your inhaler correctly is very important. Good technique will make sure that the medicine reaches your lungs.  HOW TO USE AN INHALER:  Take the cap off the inhaler.  If this is the first time using your inhaler, you need to prime it. Shake the inhaler for 5 seconds. Release four puffs into the air, away from your face. Ask your doctor for help if you have questions.  Shake the inhaler for 5 seconds.  Turn the inhaler so the bottle is above the mouthpiece.  Put your pointer finger on top of the bottle. Your thumb holds the bottom of the inhaler.  Open your mouth.  Either hold the inhaler away from your mouth (the width of 2 fingers) or place your lips tightly around the mouthpiece. Ask your doctor which way to use your inhaler.  Breathe out as much air as possible.  Breathe in and push down on the bottle 1 time to release the medicine. You will feel the medicine go in your mouth and throat.  Continue to take a deep breath in very slowly. Try to fill your lungs.  After you have breathed in completely, hold your breath for 10 seconds. This will help the medicine to settle in your lungs. If you cannot hold your breath for 10 seconds, hold it for as long as you can before you breathe out.  Breathe out slowly, through pursed lips. Whistling is an example of pursed lips.  If your doctor has told you to take more than 1 puff, wait at least 15-30 seconds between puffs. This will help you get the best results from your medicine. Do not use the inhaler more than your doctor tells you to.  Put the cap back on the inhaler.  Follow the directions from your doctor or from the inhaler package about cleaning the inhaler. If you use more than one inhaler, ask your doctor which inhalers to use and what order to use them in. Ask your doctor to help you figure out when you will need to refill your inhaler.  If you use a steroid inhaler, always rinse your mouth with water  after your last puff, gargle and spit out the water. Do not swallow the water. GET HELP IF:  The inhaler medicine only partially helps to stop wheezing or shortness of breath.  You are having trouble using your inhaler.  You have some increase in thick spit (phlegm). GET HELP RIGHT AWAY IF:  The inhaler medicine does not help your wheezing or shortness of breath or you have tightness in your chest.  You have dizziness, headaches, or fast heart rate.  You have chills, fever, or night sweats.  You have a large increase of thick spit, or your thick spit is bloody. MAKE SURE YOU:   Understand these instructions.  Will watch your condition.  Will get help right away if you are not doing well or get worse.   This information is not intended to replace advice given to you by your health care provider. Make sure you discuss any questions you have with your health care provider.   Document Released: 06/27/2008 Document Revised: 07/09/2013 Document Reviewed: 04/17/2013 Elsevier Interactive Patient Education 2016 [employer].  Otitis Media, Adult Otitis media is redness, soreness, and puffiness (swelling) in the space just behind your eardrum (middle ear). It may be caused by allergies or infection. It often happens along with a cold. HOME CARE  Take your medicine as told. [employer]  it even if you start to feel better.  Only take over-the-counter or prescription medicines for pain, discomfort, or fever as told by your doctor.  Follow up with your doctor as told. GET HELP IF:  You have otitis media only in one ear, or bleeding from your nose, or both.  You notice a lump on your neck.  You are not getting better in 3-5 days.  You feel worse instead of better. GET HELP RIGHT AWAY IF:   You have pain that is not helped with medicine.  You have puffiness, redness, or pain around your ear.  You get a stiff neck.  You cannot move part of your face (paralysis).  You notice that  the bone behind your ear hurts when you touch it. MAKE SURE YOU:   Understand these instructions.  Will watch your condition.  Will get help right away if you are not doing well or get worse.   This information is not intended to replace advice given to you by your health care provider. Make sure you discuss any questions you have with your health care provider.   Document Released: 03/06/2008 Document Revised: 10/09/2014 Document Reviewed: 04/15/2013 Elsevier Interactive Patient Education [employer] Inc.

## 2016-03-04 NOTE — ED Notes (Signed)
[facility] a [facility] and right side ear pain x 1 weeks.

## 2016-04-20 ENCOUNTER — Ambulatory Visit (INDEPENDENT_AMBULATORY_CARE_PROVIDER_SITE_OTHER): Payer: Self-pay | Admitting: Orthopedic Surgery

## 2016-04-20 ENCOUNTER — Encounter: Payer: Self-pay | Admitting: Orthopedic Surgery

## 2016-04-20 ENCOUNTER — Ambulatory Visit (HOSPITAL_COMMUNITY)
Admission: RE | Admit: 2016-04-20 | Discharge: 2016-04-20 | Disposition: A | Discharge status: Home / Self Care | Payer: Self-pay | Source: Ambulatory Visit | Attending: Orthopedic Surgery | Admitting: Orthopedic Surgery

## 2016-04-20 VITALS — BP 130/80 | HR 63 | Ht 72.0 in | Wt 193.6 lb

## 2016-04-20 DIAGNOSIS — Z9889 Other specified postprocedural states: Secondary | ICD-10-CM

## 2016-04-20 DIAGNOSIS — M25512 Pain in left shoulder: Secondary | ICD-10-CM | POA: Insufficient documentation

## 2016-04-20 NOTE — Progress Notes (Signed)
Chief Complaint  Patient presents with  . 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 He has no complaints of dislocation of pain in his left shoulder  Examination shows well-developed well-nourished male running hygiene normal oriented 3 mood affect normal inspection palpation left shoulder shows no pain or swelling he has full flexion abduction and his external rotation abduction test is normal his shoulder is stable strength in the cuff is normal skin is intact pulses are good lymph nodes are negative sensation is normal  X-ray shows no major deformity in the shoulder he has evidence of prior dislocations in the humeral head so-called [place]s.  But he is in good condition for [employer].

## 2016-04-20 NOTE — Patient Instructions (Addendum)
The patient had reconstructive surgery left shoulder for dislocation. 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

## 2016-05-16 ENCOUNTER — Encounter: Payer: Self-pay | Admitting: Orthopedic Surgery

## 2017-06-23 IMAGING — DX DG SHOULDER 2+V*L*
3 series · 3 of 3 positions shown · non-contrast
Comparison: 05/15/2007.

CLINICAL DATA: Left shoulder pain.  Left shoulder surgery .

EXAM:
LEFT SHOULDER - 2+ VIEW

[shoulder grashey]
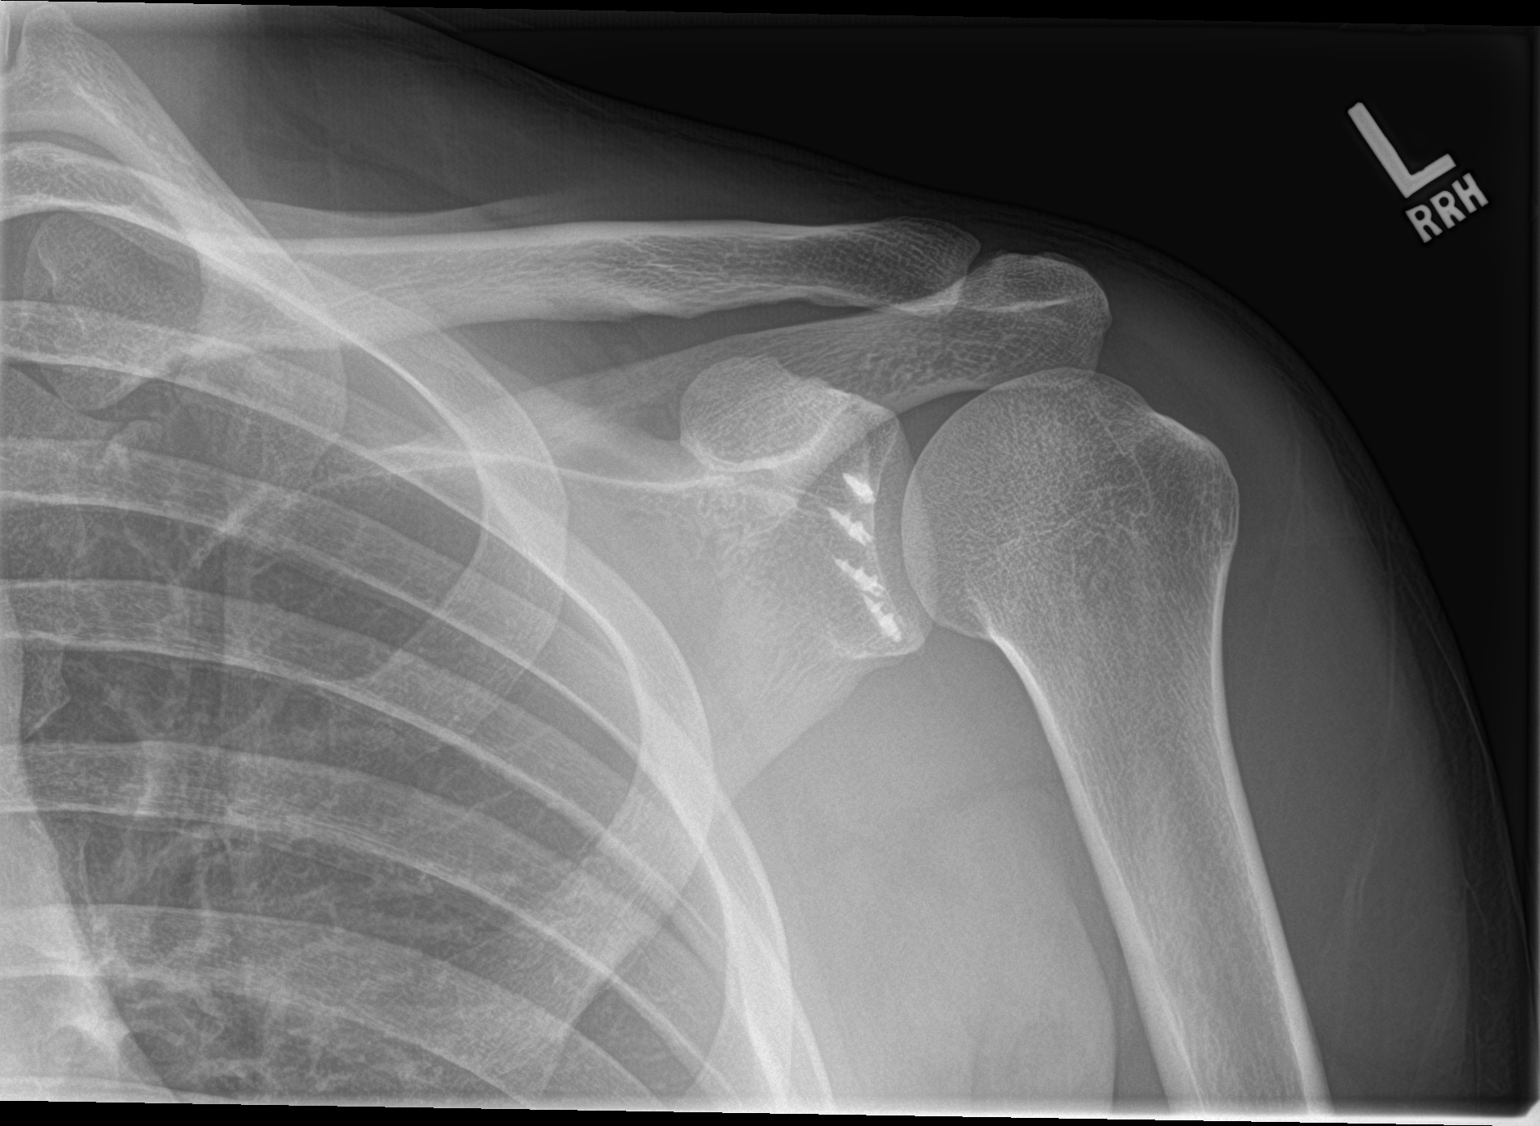

[shoulder y view]
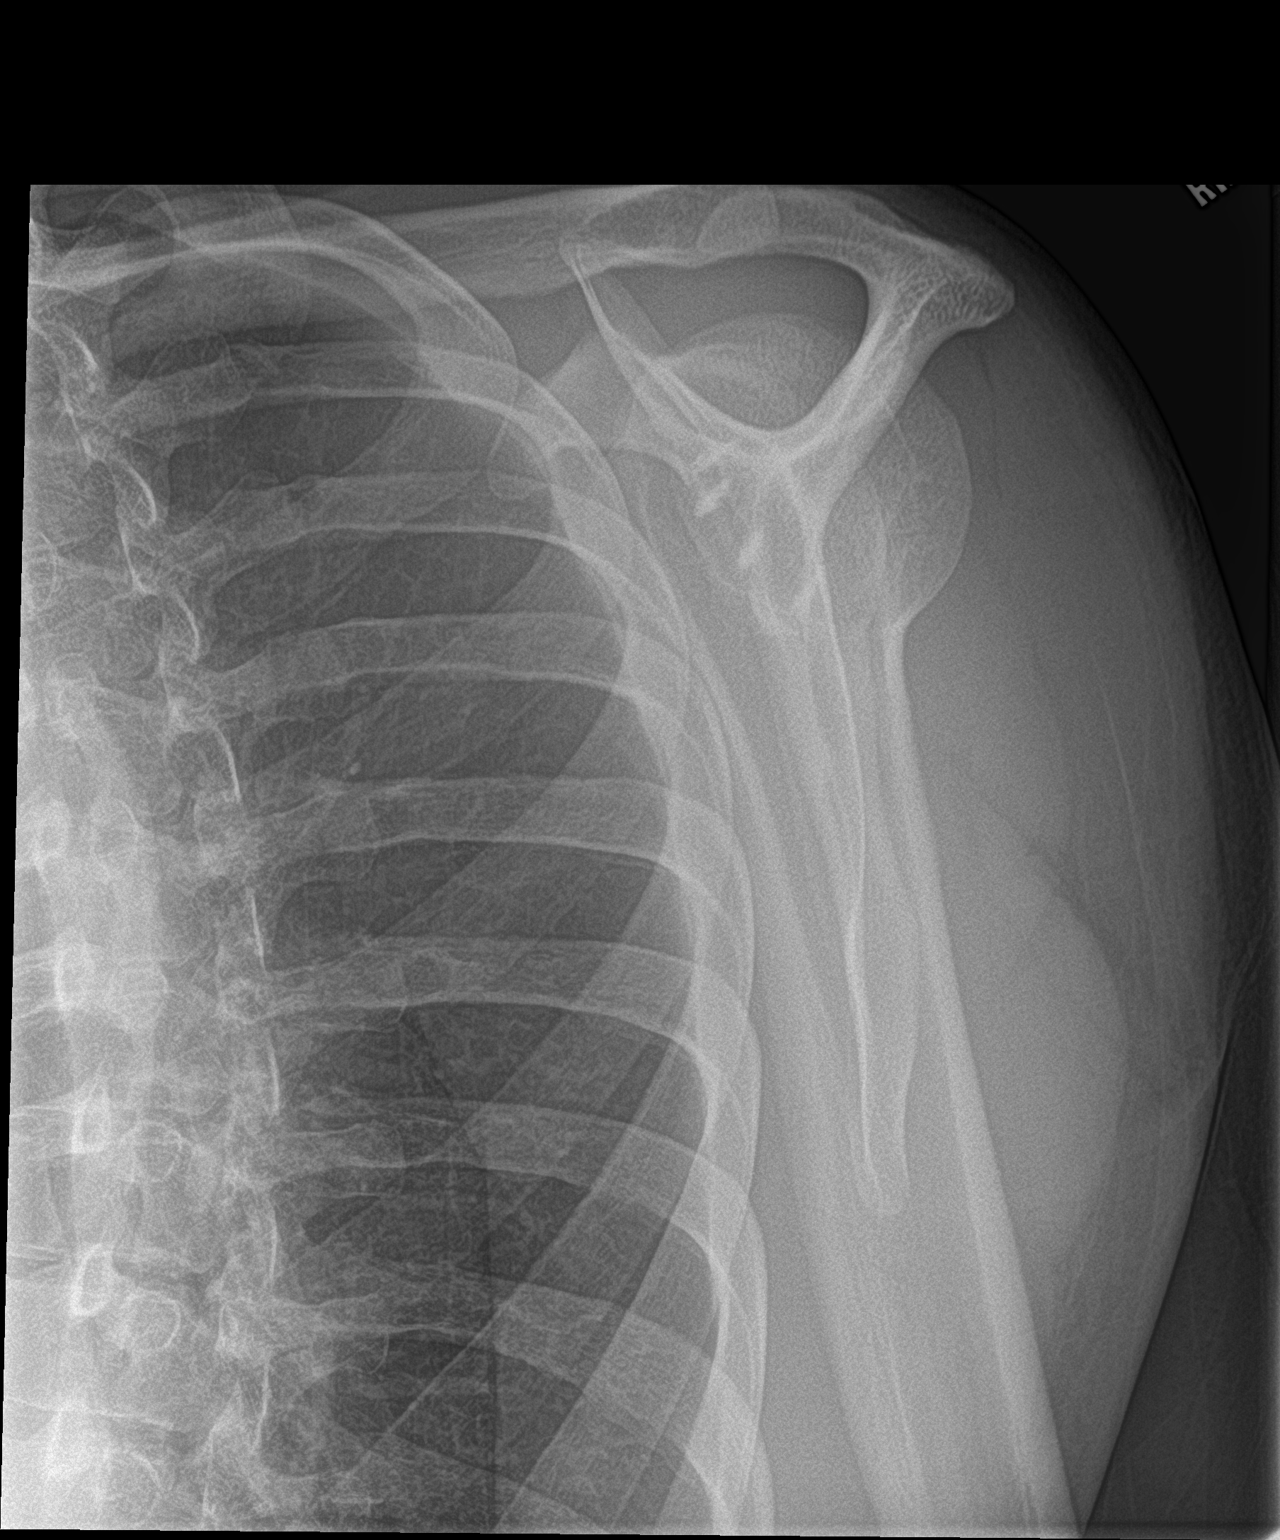

[shoulder axillary]
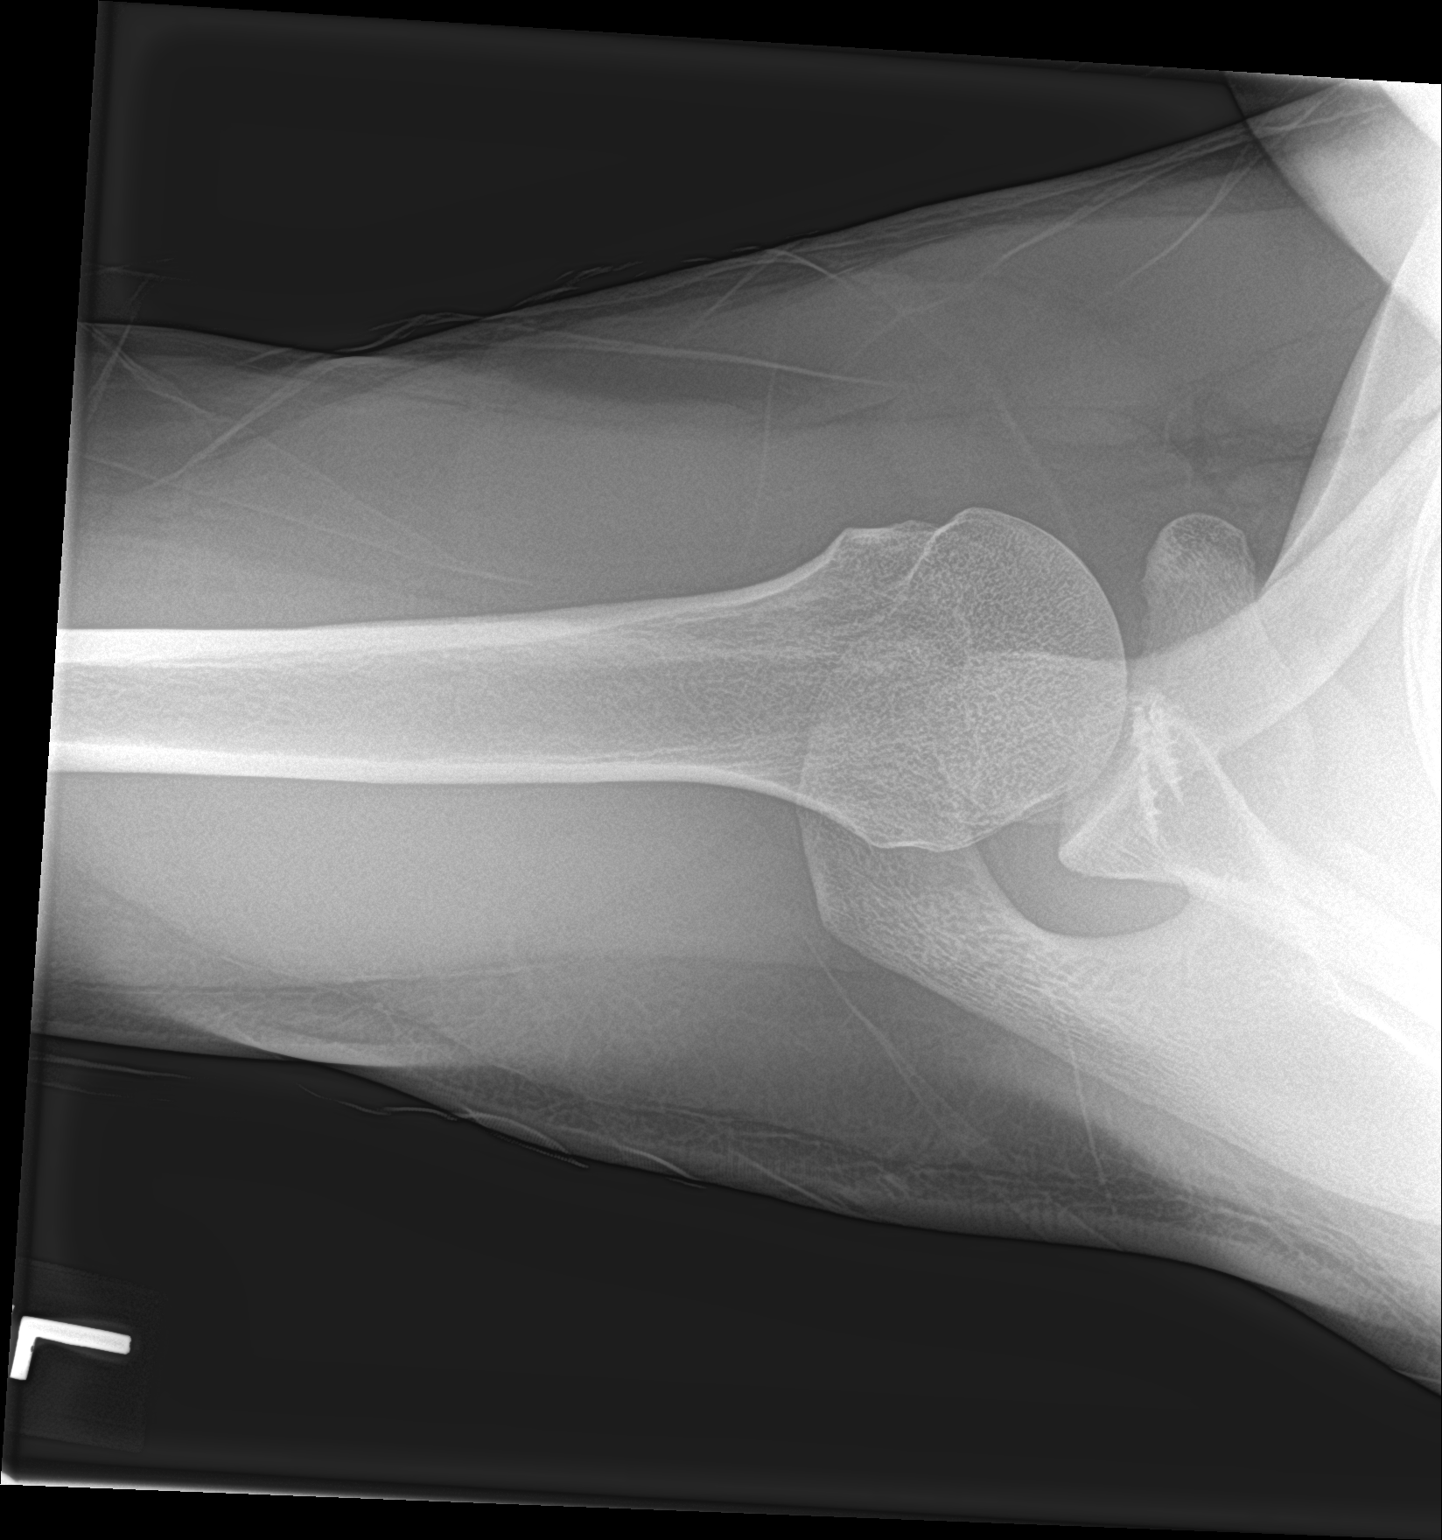

[3 of 3 positions shown; findings below may reference images not displayed]

FINDINGS: Postsurgical changes left shoulder. No acute bony or joint
abnormality. No evidence of fracture, dislocation, or separation.
IMPRESSION: No acute abnormality.

## 2018-11-07 ENCOUNTER — Encounter (HOSPITAL_COMMUNITY): Payer: Self-pay | Admitting: Emergency Medicine

## 2018-11-07 ENCOUNTER — Ambulatory Visit (HOSPITAL_COMMUNITY)
Admission: EM | Admit: 2018-11-07 | Discharge: 2018-11-07 | Disposition: A | Discharge status: Home / Self Care | Payer: 59 | Attending: Family Medicine | Admitting: Family Medicine

## 2018-11-07 DIAGNOSIS — R69 Illness, unspecified: Secondary | ICD-10-CM

## 2018-11-07 DIAGNOSIS — J111 Influenza due to unidentified influenza virus with other respiratory manifestations: Secondary | ICD-10-CM

## 2018-11-07 NOTE — ED Provider Notes (Signed)
  [facility]   [id number] 11/07/18 Arrival Time: 1219  ASSESSMENT & PLAN:  1. Influenza-like illness    See AVS for discharge instructions.  Work note provided. Discussed typical duration of symptoms. OTC symptom care as needed; he prefers. Ensure adequate fluid intake and rest. May f/u with PCP or here as needed.  Reviewed expectations re: course of current medical issues. Questions answered. Outlined signs and symptoms indicating need for more acute intervention. Patient verbalized understanding. After Visit Summary given.   SUBJECTIVE: History from: patient.  Antonio Mitchell is a 28 y.o. male who presents with complaint of nasal congestion, post-nasal drainage, and a persistent dry cough; without sore throat. Onset abrupt, about four days ago; with fatigue and with body aches. SOB: none. Wheezing: none. Fever: yes, questions subjective with chills. Overall normal PO intake without n/v. Known sick contacts: no. No specific or significant aggravating or alleviating factors reported. OTC treatment: NyQuil without much help.  Received flu shot this year: no.  Social History   Tobacco Use  Smoking Status Never Smoker  Smokeless Tobacco Never Used    ROS: As per HPI.   OBJECTIVE:  Vitals:   11/07/18 1316  BP: 134/73  Pulse: 75  Resp: 16  Temp: (!) 97.4 F (36.3 C)  TempSrc: Oral  SpO2: 100%     General appearance: alert; appears fatigued HEENT: nasal congestion; clear runny nose; throat irritation secondary to post-nasal drainage Neck: supple without LAD CV: RRR Lungs: unlabored respirations, symmetrical air entry without wheezing; cough: moderate Abd: soft Ext: no LE edema Skin: warm and dry Psychological: alert and cooperative; normal mood and affect   No Known Allergies   Social History   Socioeconomic History  . Marital status: Single    Spouse name: Not on file  . Number of children: Not on file  . Years of education: Not on file    . Highest education level: Not on file  Occupational History  . Not on file  Social Needs  . Financial resource strain: Not on file  . Food insecurity:    Worry: Not on file    Inability: Not on file  . Transportation needs:    Medical: Not on file    Non-medical: Not on file  Tobacco Use  . Smoking status: Never Smoker  . Smokeless tobacco: Never Used  Substance and Sexual Activity  . Alcohol use: No  . Drug use: No  . Sexual activity: Not on file  Lifestyle  . Physical activity:    Days per week: Not on file    Minutes per session: Not on file  . Stress: Not on file  Relationships  . Social connections:    Talks on phone: Not on file    Gets together: Not on file    Attends religious service: Not on file    Active member of club or organization: Not on file    Attends meetings of clubs or organizations: Not on file    Relationship status: Not on file  . Intimate partner violence:    Fear of current or ex partner: Not on file    Emotionally abused: Not on file    Physically abused: Not on file    Forced sexual activity: Not on file  Other Topics Concern  . Not on file  Social History Narrative  . Not on file           Mardella Layman, MD 11/07/18 [phone]

## 2018-11-07 NOTE — ED Triage Notes (Signed)
PT C/O: cold like sx onset 5 days associated w/dry cough, fevers, chills, diaphoresis, vomiting, diarreha  TAKING MEDS: OTC nyquil  A&O x4... NAD... Ambulatory

## 2018-11-07 NOTE — Discharge Instructions (Signed)

## 2018-12-24 ENCOUNTER — Ambulatory Visit: Payer: Self-pay | Admitting: Emergency Medicine
